# Patient Record
Sex: Male | Born: 1975 | Race: Black or African American | Hispanic: No | Marital: Single | Smoking: Former smoker
Health system: Southern US, Community
[De-identification: ages and names within clinical notes are randomized; demographics above are authoritative.]

---

## 2014-03-07 ENCOUNTER — Emergency Department (INDEPENDENT_AMBULATORY_CARE_PROVIDER_SITE_OTHER)
Admission: EM | Admit: 2014-03-07 | Discharge: 2014-03-07 | Disposition: A | Discharge status: Home / Self Care | Payer: Self-pay | Source: Home / Self Care | Attending: Family Medicine | Admitting: Family Medicine

## 2014-03-07 ENCOUNTER — Encounter (HOSPITAL_COMMUNITY): Payer: Self-pay | Admitting: Emergency Medicine

## 2014-03-07 DIAGNOSIS — B360 Pityriasis versicolor: Secondary | ICD-10-CM

## 2014-03-07 MED ORDER — KETOCONAZOLE 200 MG PO TABS: 200.0000 mg | ORAL_TABLET | Freq: Every day | ORAL | Status: AC

## 2014-03-07 MED ORDER — KETOCONAZOLE 2 % EX CREA: 1.0000 "application " | TOPICAL_CREAM | Freq: Every day | CUTANEOUS | Status: DC

## 2014-03-07 NOTE — ED Provider Notes (Signed)
CSN: [id number]     Arrival date & time 03/07/14  0900 History   First MD Initiated Contact with Patient 03/07/14 [phone]     Chief Complaint  Patient presents with  . Rash   (Consider location/radiation/quality/duration/timing/severity/associated sxs/prior Treatment) HPI             38 year old male presents for evaluation of a rash. He has a recurrent rash that always responds well to oral ketoconazole and he would like a refill of this medication. It is very slightly itchy. He denies any systemic symptoms. This exactly the same as previous outbreaks of the rash.  History reviewed. No pertinent past medical history. History reviewed. No pertinent past surgical history. History reviewed. No pertinent family history. History  Substance Use Topics  . Smoking status: Former Smoker -- 0.50 packs/day    Types: Cigarettes  . Smokeless tobacco: Not on file  . Alcohol Use: Not on file    Review of Systems  Skin: Positive for rash.  All other systems reviewed and are negative.   Allergies  Review of patient's allergies indicates no known allergies.  Home Medications   Prior to Admission medications   Medication Sig Start Date End Date Taking? Authorizing Provider  ketoconazole (NIZORAL) 200 MG tablet Take 1 tablet (200 mg total) by mouth daily. 03/07/14   Adrian BlackwaterZachary H Goebel Hellums, PA-C   BP 127/78 mmHg  Pulse 67  Temp(Src) 98 F (36.7 C) (Oral)  Resp 16  SpO2 98% Physical Exam  Constitutional: He is oriented to person, place, and time. He appears well-developed and well-nourished. No distress.  HENT:  Head: Normocephalic.  Pulmonary/Chest: Effort normal. No respiratory distress.  Neurological: He is alert and oriented to person, place, and time. Coordination normal.  Skin: Skin is warm and dry. Rash noted. He is not diaphoretic.  Multiple hyperpigmented patches on the extremities and trunk  Psychiatric: He has a normal mood and affect. Judgment normal.  Nursing note and vitals  reviewed.   ED Course  Procedures (including critical care time) Labs Review Labs Reviewed - No data to display  Imaging Review No results found.   MDM   1. Tinea versicolor    Ketoconazole prescribed. Follow-up when necessary   Meds ordered this encounter  Medications  . DISCONTD: ketoconazole (NIZORAL) 2 % cream    Sig: Apply 1 application topically daily.    Dispense:  120 g    Refill:  5  . ketoconazole (NIZORAL) 200 MG tablet    Sig: Take 1 tablet (200 mg total) by mouth daily.    Dispense:  7 tablet    Refill:  0       [name], PA-C 03/07/14 0930  [name], PA-C 03/07/14 [phone]

## 2014-03-07 NOTE — ED Notes (Signed)
Pt states that he has a rash on bilateral arms the go up to his back. Pt states that he has had this rash on and off for years.

## 2014-03-07 NOTE — Discharge Instructions (Signed)
Tinea Versicolor Tinea versicolor is a common yeast infection of the skin. This condition becomes known when the yeast on our skin starts to overgrow (yeast is a normal inhabitant on our skin). This condition is noticed as white or light brown patches on brown skin, and is more evident in the summer on tanned skin. These areas are slightly scaly if scratched. The light patches from the yeast become evident when the yeast creates "holes in your suntan". This is most often noticed in the summer. The patches are usually located on the chest, back, pubis, neck and body folds. However, it may occur on any area of body. Mild itching and inflammation (redness or soreness) may be present. DIAGNOSIS  The diagnosisof this is made clinically (by looking). Cultures from samples are usually not needed. Examination under the microscope may help. However, yeast is normally found on skin. The diagnosis still remains clinical. Examination under Wood's Ultraviolet Light can determine the extent of the infection. TREATMENT  This common infection is usually only of cosmetic (only a concern to your appearance). It is easily treated with dandruff shampoo used during showers or bathing. Vigorous scrubbing will eliminate the yeast over several days time. The light areas in your skin may remain for weeks or months after the infection is cured unless your skin is exposed to sunlight. The lighter or darker spots caused by the fungus that remain after complete treatment are not a sign of treatment failure; it will take a long time to resolve. Your caregiver may recommend a number of commercial preparations or medication by mouth if home care is not working. Recurrence is common and preventative medication may be necessary. This skin condition is not highly contagious. Special care is not needed to protect close friends and family members. Normal hygiene is usually enough. Follow up is required only if you develop complications (such as a  secondary infection from scratching), if recommended by your caregiver, or if no relief is obtained from the preparations used. Document Released: 02/29/2000 Document Revised: 05/26/2011 Document Reviewed: 04/12/2008 Exit[employer] Patient Information 2015 [employer]. This information is not intended to replace advice given to you by your health care provider. Make sure you discuss any questions you have with your health care provider.  

## 2014-04-20 ENCOUNTER — Emergency Department (INDEPENDENT_AMBULATORY_CARE_PROVIDER_SITE_OTHER)
Admission: EM | Admit: 2014-04-20 | Discharge: 2014-04-20 | Disposition: A | Discharge status: Home / Self Care | Payer: Self-pay | Source: Home / Self Care

## 2014-04-20 ENCOUNTER — Encounter (HOSPITAL_COMMUNITY): Payer: Self-pay | Admitting: *Deleted

## 2014-04-20 DIAGNOSIS — G5603 Carpal tunnel syndrome, bilateral upper limbs: Secondary | ICD-10-CM

## 2014-04-20 DIAGNOSIS — G5602 Carpal tunnel syndrome, left upper limb: Secondary | ICD-10-CM

## 2014-04-20 DIAGNOSIS — G5601 Carpal tunnel syndrome, right upper limb: Secondary | ICD-10-CM

## 2014-04-20 LAB — GLUCOSE, CAPILLARY: GLUCOSE-CAPILLARY: 174 mg/dL — AB (ref 70–99)

## 2014-04-20 MED ORDER — MELOXICAM 15 MG PO TABS: 15.0000 mg | ORAL_TABLET | Freq: Every day | ORAL | Status: AC

## 2014-04-20 NOTE — ED Notes (Signed)
Pt  Reports    Tingling      In  Both  Hand    Hands   Feel   Tight           denys  Any  specefic  Injury       Symptoms  X  3  Days      Pt  Is   A  [facility]ident      [facility]

## 2014-04-20 NOTE — ED Provider Notes (Signed)
   Chief Complaint   Hand Problem   History of Present Illness   Reginald RadDwayne Branaman is a 39 year old male who has had a three-day history of bilateral hand numbness, tingling, swelling, and pain. He rates the pain as 6-7/10 in intensity. Is worse in the morning when he first gets up and gets better throughout the day. He had this before a few months ago but it went away on its own. He denies any weakness the hands, wrist pain, pain in the arms, shoulders, or neck. He denies any injury, history of arthritis, or history of diabetes. He has had no numbness, tingling, or pain in the feet.  Review of Systems   Other than as noted above, the patient denies any of the following symptoms: Systemic:  No fevers or chills. Musculoskeletal:  No joint pain or arthritis.  Neurological:  No muscular weakness or paresthesias.  PMFSH   Past medical history, family history, social history, meds, and allergies were reviewed.     Physical Examination   Vital signs:  BP 141/94 mmHg  Pulse 62  Temp(Src) 97.7 F (36.5 C) (Oral)  Resp 16  SpO2 97% Gen:  Alert and in no distress. Musculoskeletal:  Exam of the hand reveals no obvious swelling. There is no pain to palpation of the wrist either over the volar aspect or the dorsal aspect. There is no swelling of the wrist or the fingers. All joints have full range of motion both actively and passively with minimal pain. Tinel's, Phalen's, and reverse Phalen signs are negative.  Otherwise, all joints had a full a ROM with no swelling, bruising or deformity.  No edema, pulses full. Extremities were warm and pink.  Capillary refill was brisk.  Skin:  Clear, warm and dry.  No rash. Neuro:  Alert and oriented.  Muscle strength was normal.  Sensation was intact to light touch.    Labs   Results for orders placed or performed during the hospital encounter of 04/20/14  Glucose, capillary  Result Value Ref Range   Glucose-Capillary 174 (H) 70 - 99 mg/dL   Course in  [facility]   He was placed in bilateral wrist splints.  Assessment   The encounter diagnosis was Bilateral carpal tunnel syndrome.  A random capillary blood glucose of 174 is also compatible with prediabetes.  Plan  1.  Meds:  The following meds were prescribed:   Discharge Medication List as of 04/20/2014 12:32 PM    START taking these medications   Details  meloxicam (MOBIC) 15 MG tablet Take 1 tablet (15 mg total) by mouth daily., Starting 04/20/2014, Until Discontinued, Print        2.  Patient Education/Counseling:  The patient was given appropriate handouts, self care instructions, and instructed in symptomatic relief, including rest and activity, and elevation. Given exercises to do. Should wear the wrist splints continuously.  3.  Follow up:  The patient was told to follow up here if no better in 3 to 4 days, or sooner if becoming worse in any way, and given some red flag symptoms such as worsening pain, fever, swelling, or neurological symptoms which would prompt immediate return.  Follow-up with primary care both for his wrist and hand problems and also for his elevated blood sugar.      Reuben Likesavid C Maraya Gwilliam, MD 04/20/14 1332

## 2014-04-20 NOTE — Discharge Instructions (Signed)
Carpal tunnel syndrome is caused by compression of the median nerve in the wrist.  Most often, inflammation of the tendons that pass through the carpal tunnel and surround the median nerve is the causative factor. ° °Wear your wrist splint as much as possible, especially at night.   ° °The following exercises should be done twice daily: ° °Exercises for Carpal Tunnel Syndrome  °Wrists Exercise 1. °• Make a loose right fist, palm up, and use the left hand to press gently down against the clenched hand. °• Resist the force with the closed right hand for 5 seconds. Be sure to keep the wrist straight. °• Turn the right fist palm down, and press the knuckles against the left open palm for 5 seconds. °• Finally, turn the right palm so the thumb-side of the fist is up, and press down again for 5 seconds. °• Repeat with the left hand.  ° Exercise 2. °• Hold one hand straight up shoulder-high with fingers together and palm facing outward. (The position looks like a shoulder-high salute.) °• With the other hand, bend the hand being exercised backward with the fingers still held together and hold for 5 seconds. °• Spread the fingers and thumb open while the hand is still bent back and hold for 5 seconds. °• Repeat five times for each hand.  ° Exercise 3. (Wrist Circle) °• Hold the second and third fingers up, and close the others. °• Draw five clockwise circles in the air with the two finger tips. °• Draw five more counterclockwise circles. °• Repeat with the other hand.  °Fingers and Hand Exercise 1. °• Clench the fingers of one hand into a fist tightly. °• Release, fanning out the fingers. °• Do this five times. Repeat with the other hand.  ° Exercise 2. °• To exercise the thumb, bend it against the palm beneath the little finger, and hold for 5 seconds. °• Spread the fingers apart, palm up, and hold for 5 seconds. °• Repeat five to 10 times with each hand.  ° Exercise 3. °• Gently pull the thumb out and back and hold for 5  seconds. °• Repeat five to 10 times with each hand.  °Forearms (stretching these muscles will reduce tension in the wrist) • Place the hands together in front of the chest, fingers pointed upward in a prayer-like position. °• Keeping the palms flat together, raise the elbows to stretch the forearm muscles. °• Stretch for 10 seconds. °• Gently shake the hands limp for a few seconds to loosen them. °• Repeat frequently when the hands or arms tire from activity.  °Neck and Shoulders Exercise 1. °• Sit upright and place the right hand on top of the left shoulder. °• Hold that shoulder down, and slowly tip the head down toward the right. °• Keep the face pointed forward, or even turned slightly toward the right. °• Hold this stretch gently for 5 seconds. °• Repeat on the other side.  ° Exercise 2. °• Stand in a relaxed position with the arms at the side. °• Shrug the shoulders up, then squeeze the shoulders back, then stretch the shoulders down, and then press them forward. °• The entire exercise should take about 7 seconds.  ° ° °If you are employed in a job that requires repetitive hand or wrist movement (this includes keyboarding or use of a computer mouse) paying attention to work ergonomics is of utmost importance: ° °Preventing CTS in Keyboard Workers °Altering the way a person performs repetitive   activities may help prevent inflammation in the hand and wrist. Most of the interventions described below have been found to reduce repetitive motion problems in the muscles and tendons of the hand and arm. They may reduce the incidence of carpal tunnel syndrome, although there is no definite proof of this effect. °Replacing old tools with ergonomically designed new ones can be very helpful. °Rest Periods and Avoiding Repetition. Anyone who does repetitive tasks should begin with a short warm-up period, take frequent breaks, and avoid overexertion of the hand and finger muscles whenever possible. Employers should be urged  to vary the tasks and work content of their employees. °Taking multiple "microbreaks" (about 3 minutes each) reduces strain and discomfort without decreasing productivity. Such breaks may include the following: °• Shaking or stretching the limbs °• Leaning back in the chair °• Squeezing the shoulder blades together. °• Taking deep breaths °Good Posture. Good posture is extremely important in preventing carpal tunnel syndrome, particularly for typists and computer users. °• The worker should sit with the spine against the back of the chair with the shoulders relaxed. °• The elbows should rest along the sides of the body, with wrists straight. °• The feet should be firmly on the floor or on a footrest. °• Typing materials should be at eye level so that the neck does not bend over the work. °• Keeping the neck flexible and head upright maintains circulation and nerve function to the arms and hands. One method for finding the correct head position is the "pigeon" movement. Keeping the chin level, glide the head slowly and gently forward and backward in small movements, avoiding neck discomfort. °Good Office Furniture. Poorly designed office furniture is a major contributor to bad posture. Chairs should be adjustable for height, with a supportive backrest. Custom-designed chairs, made for people who do not fit in standard chairs, can be expensive. However, the costs are often offset by the savings in medical expenses that follow injuries related to bad posture. °Voice Recognition Software. For CTS patients who must use a computer frequently, a variety of voice recognition software packages (ViaVoice, Voice Xpress, Dragon NaturallySpeaking, IListen) are now available, enabling virtually hands-free computer use. °Keyboard and Mouse Tips. Anyone using a keyboard and mouse has some options that may help protect the hands. °• The tension of the keys should be adjusted so they can be depressed without excessive force. °• The  hands and wrists should remain in a relaxed position to avoid excessive force on the keyboard. °• A 2003 study suggested that mouse-use poses a higher risk than keyboard use. Replacing the mouse with a trackball device and the standard keyboard with a jointed-type keyboard are helpful substitutions. °• Wrist rests, which fit under most keyboards, can help keep the wrists and fingers in a comfortable position. °• Some people recommend keeping the computer mouse as close to the keyboard and the user's body as possible, to reduce shoulder muscle movement. °• The mouse should be held lightly, with the wrist and forearm relaxed. New mouse supports are also available that relieve stress on the hand and support the wrist. °• Some people cut their mouse pads in half to reduce movement. °Innovative keyboard designs may reduce hand stress: °• Alternative geometry keyboards (Microsoft Natural Keyboard, Apple Adjustable Keyboard) allow the user to adjust and modify hand positions as well as adjust key tension. Most have a split or "slanted" keyboard that places the wrists at an angle. Studies suggest they are useful in promoting a neutral position   for the wrist. °• The continuous passive motion (CPM) keyboard lifts and declines gently and automatically every 3 minutes to break tension on the hands and wrist. °• A keyless keyboard (orbiTouch) is an innovative device that uses two domes. The typist covers the domes with their hands and slides them into different positions that represent letters. °Reducing Force from Hand Tools °The force placed on the fingers, hands, and wrists by a repetitive task is an important contributor to CTS. To alleviate the effect of force on the wrist, tools and tasks should be designed so that the wrist position is the same as it would be if the arms dangled in a relaxed manner at the sides. °• No task should require the wrist to deviate from side to side or to remain flexed or highly extended for  long periods. °• The handles of hand tools such as screwdrivers, scrapers, paint brushes, and buffers should be designed so that the force of the worker's grip is distributed across the muscle between the base of the thumb and the little finger, not just in the center of the palm. °• People who need to hold tools (including pencils and steering wheels) for long periods of time should grip them as loosely as possible. °• In order to apply force appropriately, the ability to feel an object is extremely important. Tools with textured handles are helpful. °• If possible, people should avoid working at low temperatures, which reduces sensation in hands and fingers. °• Power tools and machines should be designed to minimize vibrations. °• Wearing thick gloves, when possible, may lessen the shock transmitted to the hands and wrists. ° ° ° °

## 2014-09-10 ENCOUNTER — Encounter (HOSPITAL_COMMUNITY): Payer: Self-pay | Admitting: *Deleted

## 2014-09-10 ENCOUNTER — Emergency Department (HOSPITAL_COMMUNITY)
Admission: EM | Admit: 2014-09-10 | Discharge: 2014-09-10 | Disposition: A | Discharge status: Home / Self Care | Payer: Self-pay | Attending: Emergency Medicine | Admitting: Emergency Medicine

## 2014-09-10 DIAGNOSIS — Z79899 Other long term (current) drug therapy: Secondary | ICD-10-CM | POA: Insufficient documentation

## 2014-09-10 DIAGNOSIS — Z87891 Personal history of nicotine dependence: Secondary | ICD-10-CM | POA: Insufficient documentation

## 2014-09-10 DIAGNOSIS — L821 Other seborrheic keratosis: Secondary | ICD-10-CM | POA: Insufficient documentation

## 2014-09-10 DIAGNOSIS — B360 Pityriasis versicolor: Secondary | ICD-10-CM | POA: Insufficient documentation

## 2014-09-10 MED ORDER — ITRACONAZOLE 200 MG PO TABS: 200.0000 mg | ORAL_TABLET | Freq: Every day | ORAL | Status: AC

## 2014-09-10 NOTE — ED Provider Notes (Signed)
CSN: [id number]     Arrival date & time 09/10/14  0809 History   First MD Initiated Contact with Patient 09/10/14 [phone]     Chief Complaint  Patient presents with  . Nevus  . Rash     (Consider location/radiation/quality/duration/timing/severity/associated sxs/prior Treatment) HPI Comments: Patient presents with 2 skin complaints today. The first is recurrent tinea versicolor. Patient has been given ketoconazole on the past with for this, however patient states that it works best when prescribed as Nizoral. He denies new medications or skin exposures. No tick bites or fever. Patient's current symptoms are exactly the same as he has had in the past.  Patient also complains about a new skin growth on his lower abdomen. Patient states that his belt rubs on this area and is an annoyance. No treatments to the site.     Patient is a 39 y.o. male presenting with rash. The history is provided by the patient.  Rash Associated symptoms: no fever, no myalgias and no sore throat     History reviewed. No pertinent past medical history. History reviewed. No pertinent past surgical history. History reviewed. No pertinent family history. History  Substance Use Topics  . Smoking status: Former Smoker -- 0.50 packs/day    Types: Cigarettes  . Smokeless tobacco: Former Neurosurgeon  . Alcohol Use: No    Review of Systems  Constitutional: Negative for fever.  HENT: Negative for sore throat.   Eyes: Negative for redness.  Respiratory: Negative for cough.   Musculoskeletal: Negative for myalgias.  Skin: Positive for rash. Negative for wound.      Allergies  Bee venom  Home Medications   Prior to Admission medications   Medication Sig Start Date End Date Taking? Authorizing Provider  Itraconazole 200 MG TABS Take 200 mg by mouth daily. 09/10/14   Renne Crigler, PA-C  ketoconazole (NIZORAL) 200 MG tablet Take 1 tablet (200 mg total) by mouth daily. 03/07/14   Graylon Good, PA-C  meloxicam  (MOBIC) 15 MG tablet Take 1 tablet (15 mg total) by mouth daily. 04/20/14   Reuben Likes, MD   BP 139/88 mmHg  Pulse 67  Temp(Src) 98.5 F (36.9 C) (Oral)  Resp 16  Ht 5\' 5"  (1.651 m)  Wt 218 lb (98.884 kg)  BMI 36.28 kg/m2  SpO2 97% Physical Exam  Constitutional: He appears well-developed and well-nourished.  HENT:  Head: Normocephalic and atraumatic.  Eyes: Conjunctivae are normal.  Neck: Normal range of motion. Neck supple.  Pulmonary/Chest: No respiratory distress.  Neurological: He is alert.  Skin: Skin is warm and dry.  Patient with hypopigmented circular macules on upper chest and back consistent with tinea versicolor.  Patient with a seborrheic keratosis noted to the right lower abdomen. No surrounding cellulitis or bleeding.  Psychiatric: He has a normal mood and affect.  Nursing note and vitals reviewed.   ED Course  Procedures (including critical care time) Labs Review Labs Reviewed - No data to display  Imaging Review No results found.   EKG Interpretation None       8:50 AM Patient seen and examined.   Vital signs reviewed and are as follows: BP 139/88 mmHg  Pulse 67  Temp(Src) 98.5 F (36.9 C) (Oral)  Resp 16  Ht 5\' 5"  (1.651 m)  Wt 218 lb (98.884 kg)  BMI 36.28 kg/m2  SpO2 97%  I discussed the current recommendation to avoid ketoconazole due to possibility of hepatotoxicity area patient agrees to try a different agent. Itraconazole  prescribed.  Dermatology referral given for consideration of removal of seborrheic keratosis.   MDM   Final diagnoses:  Seborrheic keratosis  Tinea versicolor   Seborrheic keratosis: Plan is to protect this area. Dermatology follow-up if patient would like evaluation have it removed.  Tinea versicolor: Itraconazole, derm referral.     Renne Crigler, PA-C 09/10/14 1610  Eber Hong, MD 09/21/14 [phone]
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
## 2014-09-10 NOTE — ED Notes (Signed)
Declined W/C at D/C and was escorted to lobby by RN. 

## 2014-09-10 NOTE — Discharge Instructions (Signed)
Please read and follow all provided instructions.  Your diagnoses today include:  1. Seborrheic keratosis   2. Tinea versicolor    Tests performed today include:  Vital signs. See below for your results today.   Medications prescribed:   Itraconazole - medication for tinea versicolor  Take any prescribed medications only as directed.  Home care instructions:  Follow any educational materials contained in this packet.  BE VERY CAREFUL not to take multiple medicines containing Tylenol (also called acetaminophen). Doing so can lead to an overdose which can damage your liver and cause liver failure and possibly death.   Follow-up instructions: Please follow-up with your primary care provider in the next 3 days for further evaluation of your symptoms.   Return instructions:   Please return to the Emergency Department if you experience worsening symptoms.   Please return if you have any other emergent concerns.  Additional Information:  Your vital signs today were: BP 139/88 mmHg   Pulse 67   Temp(Src) 98.5 F (36.9 C) (Oral)   Resp 16   Ht 5\' 5"  (1.651 m)   Wt 218 lb (98.884 kg)   BMI 36.28 kg/m2   SpO2 97% If your blood pressure (BP) was elevated above 135/85 this visit, please have this repeated by your doctor within one month. --------------

## 2016-06-18 ENCOUNTER — Encounter (HOSPITAL_COMMUNITY): Payer: Self-pay | Admitting: *Deleted

## 2016-06-18 ENCOUNTER — Emergency Department (HOSPITAL_COMMUNITY)
Admission: EM | Admit: 2016-06-18 | Discharge: 2016-06-18 | Disposition: A | Discharge status: Home / Self Care | Payer: No Typology Code available for payment source | Attending: Physician Assistant | Admitting: Physician Assistant

## 2016-06-18 ENCOUNTER — Emergency Department (HOSPITAL_COMMUNITY): Payer: No Typology Code available for payment source

## 2016-06-18 DIAGNOSIS — S59911A Unspecified injury of right forearm, initial encounter: Secondary | ICD-10-CM | POA: Diagnosis present

## 2016-06-18 DIAGNOSIS — Z87891 Personal history of nicotine dependence: Secondary | ICD-10-CM | POA: Diagnosis not present

## 2016-06-18 DIAGNOSIS — S52571A Other intraarticular fracture of lower end of right radius, initial encounter for closed fracture: Secondary | ICD-10-CM | POA: Diagnosis not present

## 2016-06-18 DIAGNOSIS — Y92410 Unspecified street and highway as the place of occurrence of the external cause: Secondary | ICD-10-CM | POA: Diagnosis not present

## 2016-06-18 DIAGNOSIS — Y9389 Activity, other specified: Secondary | ICD-10-CM | POA: Diagnosis not present

## 2016-06-18 DIAGNOSIS — Y999 Unspecified external cause status: Secondary | ICD-10-CM | POA: Diagnosis not present

## 2016-06-18 MED ORDER — HYDROCODONE-ACETAMINOPHEN 5-325 MG PO TABS
1.0000 | ORAL_TABLET | Freq: Once | ORAL | Status: AC
  Administered 2016-06-18: 1 via ORAL
  Filled 2016-06-18: qty 1

## 2016-06-18 MED ORDER — HYDROCODONE-ACETAMINOPHEN 5-325 MG PO TABS: 1.0000 | ORAL_TABLET | ORAL | 0 refills | Status: AC | PRN

## 2016-06-18 MED ORDER — HYDROCODONE-ACETAMINOPHEN 5-325 MG PO TABS: 1.0000 | ORAL_TABLET | Freq: Once | ORAL | Status: DC

## 2016-06-18 NOTE — Progress Notes (Signed)
Orthopedic Tech Progress Note Patient Details:  Reginald Morales 1975/04/02 [id number]  Ortho Devices Type of Ortho Device: Ace wrap, Arm sling, Thumb spica splint, Sugartong splint Splint Material: Fiberglass Ortho Device/Splint Location: rue Ortho Device/Splint Interventions: Application   Reginald Morales 06/18/2016, 11:02 AM

## 2016-06-18 NOTE — ED Notes (Signed)
Ice pack given
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
## 2016-06-18 NOTE — ED Notes (Signed)
Patient went to xray 

## 2016-06-18 NOTE — ED Notes (Signed)
Paged ortho 

## 2016-06-18 NOTE — ED Notes (Signed)
Ortho tech at bedside 
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
## 2016-06-18 NOTE — ED Notes (Signed)
Order to place sugar tong and thumb spica splint to right wrist.

## 2016-06-19 ENCOUNTER — Inpatient Hospital Stay: Payer: Self-pay

## 2017-02-25 ENCOUNTER — Emergency Department (HOSPITAL_COMMUNITY)
Admission: EM | Admit: 2017-02-25 | Discharge: 2017-02-25 | Disposition: A | Discharge status: Home / Self Care | Payer: No Typology Code available for payment source | Attending: Emergency Medicine | Admitting: Emergency Medicine

## 2017-02-25 ENCOUNTER — Emergency Department (HOSPITAL_COMMUNITY): Payer: No Typology Code available for payment source

## 2017-02-25 ENCOUNTER — Other Ambulatory Visit: Payer: Self-pay

## 2017-02-25 ENCOUNTER — Encounter (HOSPITAL_COMMUNITY): Payer: Self-pay | Admitting: Emergency Medicine

## 2017-02-25 DIAGNOSIS — Y9389 Activity, other specified: Secondary | ICD-10-CM | POA: Insufficient documentation

## 2017-02-25 DIAGNOSIS — Y999 Unspecified external cause status: Secondary | ICD-10-CM | POA: Insufficient documentation

## 2017-02-25 DIAGNOSIS — Y92410 Unspecified street and highway as the place of occurrence of the external cause: Secondary | ICD-10-CM | POA: Diagnosis not present

## 2017-02-25 DIAGNOSIS — M549 Dorsalgia, unspecified: Secondary | ICD-10-CM | POA: Diagnosis not present

## 2017-02-25 DIAGNOSIS — Z87891 Personal history of nicotine dependence: Secondary | ICD-10-CM | POA: Insufficient documentation

## 2017-02-25 DIAGNOSIS — Z79899 Other long term (current) drug therapy: Secondary | ICD-10-CM | POA: Diagnosis not present

## 2017-02-25 DIAGNOSIS — S161XXA Strain of muscle, fascia and tendon at neck level, initial encounter: Secondary | ICD-10-CM

## 2017-02-25 DIAGNOSIS — S199XXA Unspecified injury of neck, initial encounter: Secondary | ICD-10-CM | POA: Diagnosis present

## 2017-02-25 DIAGNOSIS — T148XXA Other injury of unspecified body region, initial encounter: Secondary | ICD-10-CM

## 2017-02-25 MED ORDER — IBUPROFEN 600 MG PO TABS: 600.0000 mg | ORAL_TABLET | Freq: Four times a day (QID) | ORAL | 0 refills | Status: AC | PRN

## 2017-02-25 MED ORDER — CYCLOBENZAPRINE HCL 10 MG PO TABS: 10.0000 mg | ORAL_TABLET | Freq: Two times a day (BID) | ORAL | 0 refills | Status: AC | PRN

## 2017-02-25 MED ORDER — IBUPROFEN 200 MG PO TABS
600.0000 mg | ORAL_TABLET | Freq: Once | ORAL | Status: AC
  Administered 2017-02-25: 600 mg via ORAL
  Filled 2017-02-25: qty 1

## 2017-02-25 NOTE — ED Notes (Signed)
Patient transported to ct
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
## 2017-02-25 NOTE — ED Notes (Signed)
Patient transported to X-ray 

## 2017-02-25 NOTE — ED Notes (Signed)
ED Provider at bedside. 

## 2017-02-25 NOTE — Discharge Instructions (Signed)
The pain your experiencing is likely due to muscle strain, you may take Ibuprofen and flexeril as needed for pain management. Do not combine with any pain reliever other than tylenol. The muscle soreness should improve over the next week. Follow up with your family doctor in the next week for a recheck if you are still having symptoms. Return to ED if pain is worsening, you develop weakness or numbness of extremities, or new or concerning symptoms develop. ° °
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
